# Patient Record
Sex: Male | Born: 1965 | Race: Black or African American | Hispanic: No | Marital: Single | Smoking: Current every day smoker
Health system: Southern US, Community
[De-identification: ages and names within clinical notes are randomized; demographics above are authoritative.]

## PROBLEM LIST (undated history)

## (undated) DIAGNOSIS — I10 Essential (primary) hypertension: Secondary | ICD-10-CM

## (undated) DIAGNOSIS — F952 Tourette's disorder: Secondary | ICD-10-CM

---

## 2015-09-20 ENCOUNTER — Emergency Department (INDEPENDENT_AMBULATORY_CARE_PROVIDER_SITE_OTHER)
Admission: EM | Admit: 2015-09-20 | Discharge: 2015-09-20 | Disposition: A | Discharge status: Home / Self Care | Payer: Self-pay | Source: Home / Self Care | Attending: Family Medicine | Admitting: Family Medicine

## 2015-09-20 ENCOUNTER — Encounter (HOSPITAL_COMMUNITY): Payer: Self-pay | Admitting: Emergency Medicine

## 2015-09-20 DIAGNOSIS — H109 Unspecified conjunctivitis: Secondary | ICD-10-CM

## 2015-09-20 DIAGNOSIS — H5319 Other subjective visual disturbances: Secondary | ICD-10-CM

## 2015-09-20 DIAGNOSIS — H5789 Other specified disorders of eye and adnexa: Secondary | ICD-10-CM

## 2015-09-20 DIAGNOSIS — H578 Other specified disorders of eye and adnexa: Secondary | ICD-10-CM

## 2015-09-20 DIAGNOSIS — H53142 Visual discomfort, left eye: Secondary | ICD-10-CM

## 2015-09-20 DIAGNOSIS — S0592XA Unspecified injury of left eye and orbit, initial encounter: Secondary | ICD-10-CM

## 2015-09-20 HISTORY — DX: Tourette's disorder: F95.2

## 2015-09-20 MED ORDER — TOBRAMYCIN 0.3 % OP SOLN: 1.0000 [drp] | OPHTHALMIC | Status: AC

## 2015-09-20 MED ORDER — TETRACAINE HCL 0.5 % OP SOLN
OPHTHALMIC | Status: AC
  Filled 2015-09-20: qty 2

## 2015-09-20 MED ORDER — EYE WASH OPHTH SOLN
OPHTHALMIC | Status: AC
  Filled 2015-09-20: qty 118
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
## 2015-09-20 NOTE — Discharge Instructions (Signed)
You have inflammation of the left eye particularly of the lining of the eye call the conjunctiva. URI sensitive to light and possibly infected. Use the eyedrops prescribed to you as directed. Since her eye is draining clear liquid and you have pain with light we will apply an eye patch. Call the ophthalmologist listed on page one tomorrow morning at 9:00 for an appointment.   How to Use Eye Drops and Eye Ointments HOW TO APPLY EYE DROPS Follow these steps when applying eye drops: 1. Wash your hands. 2. Tilt your head back. 3. Put a finger under your eye and use it to gently pull your lower lid downward. Keep that finger in place. 4. Using your other hand, hold the dropper between your thumb and index finger. 5. Position the dropper just over the edge of the lower lid. Hold it as close to your eye as you can without touching the dropper to your eye. 6. Steady your hand. One way to do this is to lean your index finger against your brow. 7. Look up. 8. Slowly and gently squeeze one drop of medicine into your eye. 9. Close your eye. 10. Place a finger between your lower eyelid and your nose. Press gently for 2 minutes. This increases the amount of time that the medicine is exposed to the eye. It also reduces side effects that can develop if the drop gets into the bloodstream through the nose. HOW TO APPLY EYE OINTMENTS Follow these steps when applying eye ointments: 1. Wash your hands. 2. Put a finger under your eye and use it to gently pull your lower lid downward. Keep that finger in place. 3. Using your other hand, place the tip of the tube between your thumb and index finger with the remaining fingers braced against your cheek or nose. 4. Hold the tube just over the edge of your lower lid without touching the tube to your lid or eyeball. 5. Look up. 6. Line the inner part of your lower lid with ointment. 7. Gently pull up on your upper lid and look down. This will force the ointment to spread  over the surface of the eye. 8. Release the upper lid. 9. If you can, close your eyes for 1-2 minutes. Do not rub your eyes. If you applied the ointment correctly, your vision will be blurry for a few minutes. This is normal. ADDITIONAL INFORMATION  Make sure to use the eye drops or ointment as told by your health care provider.  If you have been told to use both eye drops and an eye ointment, apply the eye drops first, then wait 3-4 minutes before you apply the ointment.  Try not to touch the tip of the dropper or tube to your eye. A dropper or tube that has touched the eye can become contaminated.   This information is not intended to replace advice given to you by your health care provider. Make sure you discuss any questions you have with your health care provider.   Document Released: 12/29/2000 Document Revised: 02/06/2015 Document Reviewed: 09/18/2014 Elsevier Interactive Patient Education [employer]

## 2015-09-20 NOTE — ED Notes (Deleted)
Patient did not answer.  

## 2018-09-30 ENCOUNTER — Emergency Department (HOSPITAL_COMMUNITY): Admission: EM | Admit: 2018-09-30 | Discharge: 2018-09-30 | Discharge status: Against Medical Advice | Payer: Self-pay

## 2018-09-30 NOTE — ED Triage Notes (Signed)
Pt called for triage with no answer 

## 2018-09-30 NOTE — ED Triage Notes (Signed)
Pt called again from triage with no answer 

## 2018-11-22 ENCOUNTER — Encounter (HOSPITAL_BASED_OUTPATIENT_CLINIC_OR_DEPARTMENT_OTHER): Payer: Self-pay | Attending: Internal Medicine

## 2018-11-22 DIAGNOSIS — L97218 Non-pressure chronic ulcer of right calf with other specified severity: Secondary | ICD-10-CM | POA: Insufficient documentation

## 2018-11-22 MED FILL — SANTYL OINTMENT: 250 | 5 days supply | Qty: 30 | Fill #0

## 2018-12-09 ENCOUNTER — Encounter (HOSPITAL_BASED_OUTPATIENT_CLINIC_OR_DEPARTMENT_OTHER): Payer: Self-pay | Attending: Internal Medicine

## 2018-12-09 DIAGNOSIS — L97212 Non-pressure chronic ulcer of right calf with fat layer exposed: Secondary | ICD-10-CM | POA: Insufficient documentation

## 2018-12-09 DIAGNOSIS — I10 Essential (primary) hypertension: Secondary | ICD-10-CM | POA: Insufficient documentation

## 2019-01-07 ENCOUNTER — Encounter (HOSPITAL_BASED_OUTPATIENT_CLINIC_OR_DEPARTMENT_OTHER): Payer: Self-pay | Attending: Internal Medicine

## 2019-01-07 DIAGNOSIS — Z09 Encounter for follow-up examination after completed treatment for conditions other than malignant neoplasm: Secondary | ICD-10-CM | POA: Insufficient documentation

## 2019-01-07 DIAGNOSIS — Z872 Personal history of diseases of the skin and subcutaneous tissue: Secondary | ICD-10-CM | POA: Insufficient documentation

## 2019-01-07 DIAGNOSIS — I10 Essential (primary) hypertension: Secondary | ICD-10-CM | POA: Insufficient documentation

## 2019-01-12 ENCOUNTER — Encounter (HOSPITAL_BASED_OUTPATIENT_CLINIC_OR_DEPARTMENT_OTHER): Payer: Self-pay | Attending: Physician Assistant

## 2019-01-12 DIAGNOSIS — Z09 Encounter for follow-up examination after completed treatment for conditions other than malignant neoplasm: Secondary | ICD-10-CM | POA: Insufficient documentation

## 2019-01-12 DIAGNOSIS — I10 Essential (primary) hypertension: Secondary | ICD-10-CM | POA: Insufficient documentation

## 2019-01-12 DIAGNOSIS — Z872 Personal history of diseases of the skin and subcutaneous tissue: Secondary | ICD-10-CM | POA: Insufficient documentation

## 2019-03-15 ENCOUNTER — Emergency Department (HOSPITAL_COMMUNITY)
Admission: EM | Admit: 2019-03-15 | Discharge: 2019-03-16 | Discharge status: Against Medical Advice | Payer: No Typology Code available for payment source | Attending: Emergency Medicine | Admitting: Emergency Medicine

## 2019-03-15 DIAGNOSIS — Y92410 Unspecified street and highway as the place of occurrence of the external cause: Secondary | ICD-10-CM | POA: Insufficient documentation

## 2019-03-15 DIAGNOSIS — S161XXA Strain of muscle, fascia and tendon at neck level, initial encounter: Secondary | ICD-10-CM | POA: Diagnosis not present

## 2019-03-15 DIAGNOSIS — Y998 Other external cause status: Secondary | ICD-10-CM | POA: Insufficient documentation

## 2019-03-15 DIAGNOSIS — Z5320 Procedure and treatment not carried out because of patient's decision for unspecified reasons: Secondary | ICD-10-CM | POA: Insufficient documentation

## 2019-03-15 DIAGNOSIS — S20219A Contusion of unspecified front wall of thorax, initial encounter: Secondary | ICD-10-CM | POA: Insufficient documentation

## 2019-03-15 DIAGNOSIS — Y9389 Activity, other specified: Secondary | ICD-10-CM | POA: Insufficient documentation

## 2019-03-15 DIAGNOSIS — F17210 Nicotine dependence, cigarettes, uncomplicated: Secondary | ICD-10-CM | POA: Diagnosis not present

## 2019-03-15 DIAGNOSIS — S299XXA Unspecified injury of thorax, initial encounter: Secondary | ICD-10-CM | POA: Diagnosis present
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
## 2019-03-16 ENCOUNTER — Emergency Department (HOSPITAL_COMMUNITY): Payer: No Typology Code available for payment source

## 2019-03-16 ENCOUNTER — Other Ambulatory Visit: Payer: Self-pay

## 2019-03-16 ENCOUNTER — Encounter (HOSPITAL_COMMUNITY): Payer: Self-pay | Admitting: Emergency Medicine

## 2019-03-16 NOTE — ED Notes (Signed)
Fiance- sherri cassidy, would like an update when possible at [phone]

## 2019-03-16 NOTE — ED Notes (Signed)
Pt refusing all care. Pt stating he wants to leave. EDP Delo notified. EDP gave verbal for pt to leave AMA.
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
## 2019-08-09 ENCOUNTER — Encounter (HOSPITAL_COMMUNITY): Payer: Self-pay | Admitting: Emergency Medicine

## 2019-08-09 ENCOUNTER — Emergency Department (HOSPITAL_COMMUNITY)
Admission: EM | Admit: 2019-08-09 | Discharge: 2019-08-09 | Disposition: A | Discharge status: Home / Self Care | Payer: Self-pay | Attending: Emergency Medicine | Admitting: Emergency Medicine

## 2019-08-09 ENCOUNTER — Emergency Department (HOSPITAL_COMMUNITY): Payer: Self-pay

## 2019-08-09 DIAGNOSIS — Z79899 Other long term (current) drug therapy: Secondary | ICD-10-CM | POA: Insufficient documentation

## 2019-08-09 DIAGNOSIS — I10 Essential (primary) hypertension: Secondary | ICD-10-CM | POA: Insufficient documentation

## 2019-08-09 DIAGNOSIS — F17210 Nicotine dependence, cigarettes, uncomplicated: Secondary | ICD-10-CM | POA: Insufficient documentation

## 2019-08-09 HISTORY — DX: Essential (primary) hypertension: I10

## 2019-08-09 LAB — BASIC METABOLIC PANEL
Anion gap: 11 (ref 5–15)
BUN: 8 mg/dL (ref 6–20)
CO2: 27 mmol/L (ref 22–32)
Calcium: 9.2 mg/dL (ref 8.9–10.3)
Chloride: 100 mmol/L (ref 98–111)
Creatinine, Ser: 0.95 mg/dL (ref 0.61–1.24)
GFR calc Af Amer: >60 mL/min (ref >60)
GFR calc non Af Amer: >60 mL/min (ref >60)
Glucose, Bld: 107 mg/dL — ABNORMAL HIGH (ref 70–99)
Potassium: 3.9 mmol/L (ref 3.5–5.1)
Sodium: 138 mmol/L (ref 135–145)

## 2019-08-09 MED ORDER — CHLORTHALIDONE 25 MG PO TABS: 12.5000 mg | ORAL_TABLET | Freq: Every day | ORAL | 0 refills | Status: AC

## 2019-08-09 NOTE — Discharge Instructions (Addendum)
Please read attached information. If you experience any new or worsening signs or symptoms please return to the emergency room for evaluation. Please follow-up with your primary care provider or specialist as discussed. Please use medication prescribed only as directed and discontinue taking if you have any concerning signs or symptoms.   °
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
## 2019-08-09 NOTE — ED Provider Notes (Addendum)
[facility] EMERGENCY DEPARTMENT Provider Note   CSN: [id number] Arrival date & time: 08/09/19  0093     History   Chief Complaint Chief Complaint  Patient presents with   Ankle Pain    HPI Paul Ramirez is a 53 y.o. male.     HPI   53 year old male presents today with complaints of right ankle pain.  Patient notes he woke up in the middle night with pain in his right medial ankle.  Patient notes that he sanded floors yesterday but does not recall an injury.  He notes this is point tender.  He denies any loss of range of motion swelling redness warmth or fever.  No history of gout.  No history of the same.  He does note a history of traumatic injury to his right ankle with hardware placement.  No medications prior to arrival.  He also notes a history of hypertension, he notes he has not been on any antihypertensive medications although he has been told several times.  Denies any chest pain headache or any other signs of endorgan damage.  Past Medical History:  Diagnosis Date   Hypertension    Tourette's     There are no active problems to display for this patient.   History reviewed. No pertinent surgical history.      Home Medications    Prior to Admission medications   Medication Sig Start Date End Date Taking? Authorizing Provider  chlorthalidone (HYGROTON) 25 MG tablet Take 0.5 tablets (12.5 mg total) by mouth daily. 08/09/19   Tonji Elliff, Dellis Filbert, PA-C  tobramycin (TOBREX) 0.3 % ophthalmic solution Place 1 drop into the left eye every 4 (four) hours. X 5 days 09/20/15   Janne Napoleon, NP    Family History History reviewed. No pertinent family history.  Social History Social History   Tobacco Use   Smoking status: Current Every Day Smoker    Packs/day: 0.50   Smokeless tobacco: Never Used  Substance Use Topics   Alcohol use: Yes   Drug use: No     Allergies   Patient has no known allergies.   Review of Systems Review of Systems   All other systems reviewed and are negative.    Physical Exam Updated Vital Signs BP (!) 216/119    Pulse 74    Temp 98.3 F (36.8 C) (Oral)    Resp 16    SpO2 100%   Physical Exam Vitals signs and nursing note reviewed.  Constitutional:      Appearance: He is well-developed.  HENT:     Head: Normocephalic and atraumatic.  Eyes:     General: No scleral icterus.       Right eye: No discharge.        Left eye: No discharge.     Conjunctiva/sclera: Conjunctivae normal.     Pupils: Pupils are equal, round, and reactive to light.  Neck:     Musculoskeletal: Normal range of motion.     Vascular: No JVD.     Trachea: No tracheal deviation.  Cardiovascular:     Rate and Rhythm: Normal rate and regular rhythm.  Pulmonary:     Effort: Pulmonary effort is normal.     Breath sounds: No stridor.  Musculoskeletal:     Comments: Tenderness palpation of the right medial ankle below the malleolus, no laxity no redness warmth to touch full active range of motion sensation intact, no edema  Neurological:     Mental Status: He is alert  and oriented to person, place, and time.     Coordination: Coordination normal.  Psychiatric:        Behavior: Behavior normal.        Thought Content: Thought content normal.        Judgment: Judgment normal.      ED Treatments / Results  Labs (all labs ordered are listed, but only abnormal results are displayed) Labs Reviewed  BASIC METABOLIC PANEL - Abnormal; Notable for the following components:      Result Value   Glucose, Bld 107 (*)    All other components within normal limits    EKG None  Radiology Dg Ankle Complete Right  Result Date: 08/09/2019 CLINICAL DATA:  Pain, no known recent injury. EXAM: RIGHT ANKLE - COMPLETE 3+ VIEW COMPARISON:  None. FINDINGS: Osseous alignment is normal. Ankle mortise is symmetric. No fracture line or displaced fracture fragment. No acute or suspicious osseous lesion. 2 fixation screws appear well seated  at the medial malleolus. No significant degenerative change seen. Visualized portions of the hindfoot and midfoot are unremarkable. Soft tissues about the RIGHT ankle are unremarkable. IMPRESSION: 1. No acute findings. No osseous abnormality. 2. 2 fixation screws appear well seated at the medial malleolus. Electronically Signed   By: Bary Richard M.D.   On: 08/09/2019 10:03    Procedures Procedures (including critical care time)  Medications Ordered in ED Medications - No data to display   Initial Impression / Assessment and Plan / ED Course  I have reviewed the triage vital signs and the nursing notes.  Pertinent labs & imaging results that were available during my care of the patient were reviewed by me and considered in my medical decision making (see chart for details).        53 year old male presents today with complaints of ankle pain.  This is atraumatic x-rays without acute abnormalities.  Patient was placed in ASO and crutches no signs of infectious etiology.  Patient also hypertensive here he is asymptomatic from this but given his longstanding history will obtain basic labs and initiate low-dose HCTZ therapy with close outpatient follow-up with primary care.  Patient verbalized understanding and agreement to today's plan had no further questions or concerns at the time of discharge.  Final Clinical Impressions(s) / ED Diagnoses   Final diagnoses:  Hypertension, unspecified type    ED Discharge Orders         Ordered    Apply ASO ankle     08/09/19 1121    Crutches     08/09/19 1121    chlorthalidone (HYGROTON) 25 MG tablet  Daily     08/09/19 1122           Eyvonne Mechanic, PA-C 08/09/19 1543    Eyvonne Mechanic, PA-C 08/09/19 1544    Lorre Nick, MD 08/11/19 1319
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
